# Patient Record
Sex: Male | Born: 1990 | Hispanic: Yes | Marital: Single | Smoking: Current every day smoker
Health system: Southern US, Community
[De-identification: ages and names within clinical notes are randomized; demographics above are authoritative.]

## PROBLEM LIST (undated history)

## (undated) DIAGNOSIS — J40 Bronchitis, not specified as acute or chronic: Secondary | ICD-10-CM

---

## 2020-08-11 ENCOUNTER — Encounter: Payer: Self-pay | Admitting: Emergency Medicine

## 2020-08-11 ENCOUNTER — Other Ambulatory Visit: Payer: Self-pay

## 2020-08-11 ENCOUNTER — Emergency Department
Admission: EM | Admit: 2020-08-11 | Discharge: 2020-08-11 | Disposition: A | Discharge status: Home / Self Care | Payer: Self-pay | Attending: Emergency Medicine | Admitting: Emergency Medicine

## 2020-08-11 ENCOUNTER — Emergency Department: Payer: Self-pay

## 2020-08-11 DIAGNOSIS — S0081XA Abrasion of other part of head, initial encounter: Secondary | ICD-10-CM | POA: Insufficient documentation

## 2020-08-11 DIAGNOSIS — W2111XA Struck by baseball bat, initial encounter: Secondary | ICD-10-CM | POA: Insufficient documentation

## 2020-08-11 DIAGNOSIS — Y92019 Unspecified place in single-family (private) house as the place of occurrence of the external cause: Secondary | ICD-10-CM | POA: Insufficient documentation

## 2020-08-11 DIAGNOSIS — T07XXXA Unspecified multiple injuries, initial encounter: Secondary | ICD-10-CM

## 2020-08-11 DIAGNOSIS — S0003XA Contusion of scalp, initial encounter: Secondary | ICD-10-CM | POA: Insufficient documentation

## 2020-08-11 MED ORDER — NAPROXEN 500 MG PO TABS: 500.0000 mg | ORAL_TABLET | Freq: Two times a day (BID) | ORAL | 0 refills | Status: DC

## 2020-08-11 MED ORDER — CYCLOBENZAPRINE HCL 10 MG PO TABS
10.0000 mg | ORAL_TABLET | Freq: Once | ORAL | Status: AC
  Administered 2020-08-11: 10 mg via ORAL
  Filled 2020-08-11: qty 1

## 2020-08-11 MED ORDER — NAPROXEN 500 MG PO TABS
500.0000 mg | ORAL_TABLET | Freq: Once | ORAL | Status: AC
  Administered 2020-08-11: 500 mg via ORAL
  Filled 2020-08-11: qty 1

## 2020-08-11 MED ORDER — ORPHENADRINE CITRATE ER 100 MG PO TB12: 100.0000 mg | ORAL_TABLET | Freq: Two times a day (BID) | ORAL | 0 refills | Status: DC
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
## 2020-08-11 NOTE — Discharge Instructions (Signed)
No acute findings on CT of the head and neck.  X-ray was negative for left shoulder fracture.  Read and follow discharge care instruction.  Wear arm sling for 2 days for comfort.  Take medication as directed.
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
## 2020-08-11 NOTE — ED Notes (Signed)
Video interpreter Britta Mccreedy [phone]

## 2020-08-11 NOTE — ED Notes (Signed)
Video Interpreter Meta Hatchet [phone]
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
## 2021-08-07 ENCOUNTER — Emergency Department: Payer: Self-pay

## 2021-08-07 ENCOUNTER — Emergency Department
Admission: EM | Admit: 2021-08-07 | Discharge: 2021-08-08 | Disposition: A | Discharge status: Home / Self Care | Payer: Self-pay | Attending: Emergency Medicine | Admitting: Emergency Medicine

## 2021-08-07 ENCOUNTER — Encounter: Payer: Self-pay | Admitting: Emergency Medicine

## 2021-08-07 ENCOUNTER — Other Ambulatory Visit: Payer: Self-pay

## 2021-08-07 DIAGNOSIS — W3400XA Accidental discharge from unspecified firearms or gun, initial encounter: Secondary | ICD-10-CM | POA: Insufficient documentation

## 2021-08-07 DIAGNOSIS — S61239A Puncture wound without foreign body of unspecified finger without damage to nail, initial encounter: Secondary | ICD-10-CM

## 2021-08-07 DIAGNOSIS — S68121A Partial traumatic metacarpophalangeal amputation of left index finger, initial encounter: Secondary | ICD-10-CM | POA: Insufficient documentation

## 2021-08-07 DIAGNOSIS — S68119A Complete traumatic metacarpophalangeal amputation of unspecified finger, initial encounter: Secondary | ICD-10-CM

## 2021-08-07 MED ORDER — LIDOCAINE HCL (PF) 1 % IJ SOLN
10.0000 mL | Freq: Once | INTRAMUSCULAR | Status: AC
  Administered 2021-08-07: 10 mL via INTRADERMAL
  Filled 2021-08-07: qty 10

## 2021-08-07 MED ORDER — MORPHINE SULFATE (PF) 4 MG/ML IV SOLN
4.0000 mg | Freq: Once | INTRAVENOUS | Status: AC
  Administered 2021-08-07: 4 mg via INTRAVENOUS
  Filled 2021-08-07: qty 1
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
## 2021-08-07 NOTE — ED Notes (Signed)
Dr. Larinda Buttery at the bedside, using online interpreter
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
## 2021-08-08 MED ORDER — OXYCODONE-ACETAMINOPHEN 5-325 MG PO TABS: 1.0000 | ORAL_TABLET | ORAL | 0 refills | Status: DC | PRN

## 2021-08-08 MED ORDER — OXYCODONE-ACETAMINOPHEN 5-325 MG PO TABS
1.0000 | ORAL_TABLET | Freq: Once | ORAL | Status: AC
  Administered 2021-08-08: 1 via ORAL
  Filled 2021-08-08: qty 1

## 2021-08-08 MED ORDER — CEPHALEXIN 500 MG PO CAPS: 500.0000 mg | ORAL_CAPSULE | Freq: Four times a day (QID) | ORAL | 0 refills | Status: AC

## 2021-08-08 NOTE — ED Notes (Signed)
Pt's left hand and wound to left 2nd digit was cleaned with sterile water and betadine. Xeroform and hemostatic gauze wrap around pt's 2nd digit of left hand, secure with guaze, finger spliint and left hand wrapped in Kerlix.

## 2021-08-08 NOTE — ED Notes (Signed)
Dr. Charna Archer at the bedside to discuss POC and discharge, using online interpreter

## 2021-08-08 NOTE — ED Notes (Signed)
Pt reports pain to his left hand/2nd digit, Dr. Charna Archer made aware, refer to [facility]
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
## 2021-08-08 NOTE — ED Notes (Signed)
C-com called/officer will be sent
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
## 2021-08-08 NOTE — ED Notes (Signed)
[employer] office Dewaine Conger to speak with pt to get more details of location

## 2021-08-08 NOTE — ED Notes (Signed)
[facility] at the bedside

## 2022-04-18 ENCOUNTER — Other Ambulatory Visit: Payer: Self-pay

## 2022-04-18 ENCOUNTER — Emergency Department: Payer: Self-pay

## 2022-04-18 ENCOUNTER — Inpatient Hospital Stay
Admission: EM | Admit: 2022-04-18 | Discharge: 2022-04-21 | DRG: 202 | Disposition: A | Discharge status: Home / Self Care | Payer: Self-pay | Attending: Family Medicine | Admitting: Family Medicine

## 2022-04-18 DIAGNOSIS — Z833 Family history of diabetes mellitus: Secondary | ICD-10-CM

## 2022-04-18 DIAGNOSIS — J209 Acute bronchitis, unspecified: Principal | ICD-10-CM | POA: Diagnosis present

## 2022-04-18 DIAGNOSIS — F10130 Alcohol abuse with withdrawal, uncomplicated: Secondary | ICD-10-CM | POA: Diagnosis present

## 2022-04-18 DIAGNOSIS — J9601 Acute respiratory failure with hypoxia: Secondary | ICD-10-CM

## 2022-04-18 DIAGNOSIS — D72829 Elevated white blood cell count, unspecified: Secondary | ICD-10-CM | POA: Diagnosis not present

## 2022-04-18 DIAGNOSIS — F1210 Cannabis abuse, uncomplicated: Secondary | ICD-10-CM | POA: Diagnosis present

## 2022-04-18 DIAGNOSIS — T380X5A Adverse effect of glucocorticoids and synthetic analogues, initial encounter: Secondary | ICD-10-CM | POA: Diagnosis not present

## 2022-04-18 DIAGNOSIS — F1010 Alcohol abuse, uncomplicated: Secondary | ICD-10-CM | POA: Diagnosis present

## 2022-04-18 DIAGNOSIS — Z1152 Encounter for screening for COVID-19: Secondary | ICD-10-CM

## 2022-04-18 LAB — RESP PANEL BY RT-PCR (RSV, FLU A&B, COVID)  RVPGX2
Influenza A by PCR: NEGATIVE
Influenza B by PCR: NEGATIVE
Resp Syncytial Virus by PCR: NEGATIVE
SARS Coronavirus 2 by RT PCR: NEGATIVE

## 2022-04-18 LAB — BASIC METABOLIC PANEL
Anion gap: 10 (ref 5–15)
BUN: 13 mg/dL (ref 6–20)
CO2: 19 mmol/L — ABNORMAL LOW (ref 22–32)
Calcium: 8.2 mg/dL — ABNORMAL LOW (ref 8.9–10.3)
Chloride: 108 mmol/L (ref 98–111)
Creatinine, Ser: 0.9 mg/dL (ref 0.61–1.24)
GFR, Estimated: >60 mL/min (ref >60)
Glucose, Bld: 147 mg/dL — ABNORMAL HIGH (ref 70–99)
Potassium: 3.5 mmol/L (ref 3.5–5.1)
Sodium: 137 mmol/L (ref 135–145)

## 2022-04-18 LAB — CBC
HCT: 53.2 % — ABNORMAL HIGH (ref 39.0–52.0)
Hemoglobin: 18.7 g/dL — ABNORMAL HIGH (ref 13.0–17.0)
MCH: 31.1 pg (ref 26.0–34.0)
MCHC: 35.2 g/dL (ref 30.0–36.0)
MCV: 88.4 fL (ref 80.0–100.0)
Platelets: 267 10*3/uL (ref 150–400)
RBC: 6.02 MIL/uL — ABNORMAL HIGH (ref 4.22–5.81)
RDW: 12.2 % (ref 11.5–15.5)
WBC: 8.8 10*3/uL (ref 4.0–10.5)
nRBC: 0 % (ref 0.0–0.2)

## 2022-04-18 LAB — TROPONIN I (HIGH SENSITIVITY)
Troponin I (High Sensitivity): 4 ng/L (ref <18)
Troponin I (High Sensitivity): 5 ng/L (ref <18)

## 2022-04-18 MED ORDER — ALBUTEROL SULFATE (2.5 MG/3ML) 0.083% IN NEBU: 2.5000 mg | INHALATION_SOLUTION | Freq: Once | RESPIRATORY_TRACT | Status: DC

## 2022-04-18 MED ORDER — SODIUM CHLORIDE 0.9 % IV BOLUS
500.0000 mL | Freq: Once | INTRAVENOUS | Status: AC
  Administered 2022-04-18: 500 mL via INTRAVENOUS

## 2022-04-18 MED ORDER — IPRATROPIUM-ALBUTEROL 0.5-2.5 (3) MG/3ML IN SOLN
3.0000 mL | Freq: Four times a day (QID) | RESPIRATORY_TRACT | Status: DC
  Administered 2022-04-19 – 2022-04-21 (×7): 3 mL via RESPIRATORY_TRACT
  Filled 2022-04-18 (×7): qty 3

## 2022-04-18 MED ORDER — ACETAMINOPHEN 325 MG PO TABS
650.0000 mg | ORAL_TABLET | Freq: Four times a day (QID) | ORAL | Status: DC | PRN
  Administered 2022-04-19 – 2022-04-21 (×2): 650 mg via ORAL
  Filled 2022-04-18 (×2): qty 2

## 2022-04-18 MED ORDER — TRAZODONE HCL 50 MG PO TABS: 25.0000 mg | ORAL_TABLET | Freq: Every evening | ORAL | Status: DC | PRN

## 2022-04-18 MED ORDER — IPRATROPIUM-ALBUTEROL 0.5-2.5 (3) MG/3ML IN SOLN
3.0000 mL | Freq: Once | RESPIRATORY_TRACT | Status: AC
  Administered 2022-04-18: 3 mL via RESPIRATORY_TRACT
  Filled 2022-04-18: qty 3

## 2022-04-18 MED ORDER — ONDANSETRON HCL 4 MG/2ML IJ SOLN: 4.0000 mg | Freq: Four times a day (QID) | INTRAMUSCULAR | Status: DC | PRN

## 2022-04-18 MED ORDER — ENOXAPARIN SODIUM 40 MG/0.4ML IJ SOSY
40.0000 mg | PREFILLED_SYRINGE | INTRAMUSCULAR | Status: DC
  Administered 2022-04-19 – 2022-04-21 (×3): 40 mg via SUBCUTANEOUS
  Filled 2022-04-18 (×3): qty 0.4

## 2022-04-18 MED ORDER — METHYLPREDNISOLONE SODIUM SUCC 125 MG IJ SOLR
125.0000 mg | Freq: Once | INTRAMUSCULAR | Status: AC
  Administered 2022-04-18: 125 mg via INTRAVENOUS
  Filled 2022-04-18: qty 2

## 2022-04-18 MED ORDER — ONDANSETRON HCL 4 MG PO TABS: 4.0000 mg | ORAL_TABLET | Freq: Four times a day (QID) | ORAL | Status: DC | PRN

## 2022-04-18 MED ORDER — ALBUTEROL (5 MG/ML) CONTINUOUS INHALATION SOLN
10.0000 mg/h | INHALATION_SOLUTION | Freq: Once | RESPIRATORY_TRACT | Status: AC
  Administered 2022-04-18: 10 mg/h via RESPIRATORY_TRACT
  Filled 2022-04-18: qty 20

## 2022-04-18 MED ORDER — PREDNISONE 20 MG PO TABS
40.0000 mg | ORAL_TABLET | Freq: Every day | ORAL | Status: DC
  Administered 2022-04-20 – 2022-04-21 (×2): 40 mg via ORAL
  Filled 2022-04-18 (×2): qty 2

## 2022-04-18 MED ORDER — METHYLPREDNISOLONE SODIUM SUCC 40 MG IJ SOLR
40.0000 mg | Freq: Two times a day (BID) | INTRAMUSCULAR | Status: AC
  Administered 2022-04-19 (×2): 40 mg via INTRAVENOUS
  Filled 2022-04-18 (×2): qty 1

## 2022-04-18 MED ORDER — SODIUM CHLORIDE 0.9 % IV SOLN: INTRAVENOUS | Status: DC

## 2022-04-18 MED ORDER — MAGNESIUM SULFATE 2 GM/50ML IV SOLN
2.0000 g | Freq: Once | INTRAVENOUS | Status: AC
  Administered 2022-04-18: 2 g via INTRAVENOUS
  Filled 2022-04-18: qty 50

## 2022-04-18 MED ORDER — ACETAMINOPHEN 650 MG RE SUPP: 650.0000 mg | Freq: Four times a day (QID) | RECTAL | Status: DC | PRN

## 2022-04-18 MED ORDER — MAGNESIUM HYDROXIDE 400 MG/5ML PO SUSP: 30.0000 mL | Freq: Every day | ORAL | Status: DC | PRN

## 2022-04-18 NOTE — ED Provider Notes (Signed)
[facility] Provider Note    Event Date/Time   First MD Initiated Contact with Patient 04/18/22 1946     (approximate)   History   Shortness of Breath   HPI  Vincent Bradshaw is a 32 y.o. male with no significant past medical history who presents with increased shortness of breath over the last 3 days with a feeling of "choking" today which he mainly feels in his chest rather than in the throat.  The patient states that he has had a persistent cough for about 3 weeks which was getting better until the last several days.  He denies any fevers and has no vomiting or diarrhea.  He denies sore throat.  He states his kids were sick with similar symptoms.  I reviewed the past medical records.  The patient was most recently seen in the ED in June of last year for a finger injury.  He has no prior documented medical encounters outside of the ED.   Physical Exam   Triage Vital Signs: ED Triage Vitals  Enc Vitals Group     BP 04/18/22 1926 (!) 145/102     Pulse Rate 04/18/22 1926 (!) 116     Resp 04/18/22 1926 (!) 38     Temp 04/18/22 1926 97.9 F (36.6 C)     Temp Source 04/18/22 1926 Oral     SpO2 04/18/22 1926 94 %     Weight 04/18/22 1941 144 lb (65.3 kg)     Height 04/18/22 1941 4' 11.06" (1.5 m)     Head Circumference --      Peak Flow --      Pain Score 04/18/22 1941 5     Pain Loc --      Pain Edu? --      Excl. in [place]? --     Most recent vital signs: Vitals:   04/18/22 2338 04/18/22 2339  BP:    Pulse:    Resp:    Temp:    SpO2: 94% 94%     General: Awake, no distress.  CV:  Good peripheral perfusion.  Normal heart sounds. Resp:  Slightly increased respiratory effort.  Diffuse wheezing bilaterally with poor air entry. Abd:  No distention.  Other:  No peripheral edema.  Oropharynx clear with no erythema or exudate.  No pooled secretions or stridor.   ED Results / Procedures / Treatments   Labs (all labs ordered are  listed, but only abnormal results are displayed) Labs Reviewed  BASIC METABOLIC PANEL - Abnormal; Notable for the following components:      Result Value   CO2 19 (*)    Glucose, Bld 147 (*)    Calcium 8.2 (*)    All other components within normal limits  CBC - Abnormal; Notable for the following components:   RBC 6.02 (*)    Hemoglobin 18.7 (*)    HCT 53.2 (*)    All other components within normal limits  RESP PANEL BY RT-PCR (RSV, FLU A&B, COVID)  [id number]  TROPONIN I (HIGH SENSITIVITY)  TROPONIN I (HIGH SENSITIVITY)     EKG  ED ECG REPORT I, Arta Silence, the attending physician, personally viewed and interpreted this ECG.  Date: 04/18/2022 EKG Time: 1933  Rate: 112 Rhythm: Sinus tachycardia QRS Axis: Right axis Intervals: normal ST/T Wave abnormalities: normal Narrative Interpretation: no evidence of acute ischemia    RADIOLOGY  Chest x-ray: I independently viewed and interpreted the images; there is no focal  consolidation or edema  PROCEDURES:  Critical Care performed: No  .Critical Care  Performed by: Arta Silence, MD Authorized by: Arta Silence, MD   Critical care provider statement:    Critical care time (minutes):  30   Critical care time was exclusive of:  Separately billable procedures and treating other patients   Critical care was necessary to treat or prevent imminent or life-threatening deterioration of the following conditions:  Respiratory failure   Critical care was time spent personally by me on the following activities:  Development of treatment plan with patient or surrogate, discussions with consultants, evaluation of patient's response to treatment, examination of patient, ordering and review of laboratory studies, ordering and review of radiographic studies, ordering and performing treatments and interventions, pulse oximetry, re-evaluation of patient's condition, review of old charts and obtaining history from patient or  surrogate   Care discussed with: admitting provider      MEDICATIONS ORDERED IN ED: Medications  ipratropium-albuterol (DUONEB) 0.5-2.5 (3) MG/3ML nebulizer solution 3 mL (3 mLs Nebulization Given 04/18/22 2015)  ipratropium-albuterol (DUONEB) 0.5-2.5 (3) MG/3ML nebulizer solution 3 mL (3 mLs Nebulization Given 04/18/22 2027)  methylPREDNISolone sodium succinate (SOLU-MEDROL) 125 mg/2 mL injection 125 mg (125 mg Intravenous Given 04/18/22 2015)  magnesium sulfate IVPB 2 g 50 mL (0 g Intravenous Stopped 04/18/22 2115)  sodium chloride 0.9 % bolus 500 mL (0 mLs Intravenous Stopped 04/18/22 2130)  sodium chloride 0.9 % bolus 500 mL (0 mLs Intravenous Stopped 04/18/22 2220)  albuterol (PROVENTIL,VENTOLIN) solution continuous neb (10 mg/hr Nebulization Given 04/18/22 2220)     IMPRESSION / MDM / [place] / ED COURSE  I reviewed the triage vital signs and the nursing notes.  32 year old male with PMH as noted above presents with cough for about 3 weeks, but with worsening shortness of breath and chest tightness over the last several days.  On exam the patient has significant wheezing and somewhat poor air entry although does not appear in respiratory distress.  O2 saturation is in the mid 90s on room air.  Differential diagnosis includes, but is not limited to, acute bronchitis, reactive airways/bronchospasm, pneumonia, COVID-19, influenza, other viral syndrome.  We will obtain chest x-ray, lab workup, respiratory panel, give fluids, bronchodilators, steroid, and magnesium and reassess.  Patient's presentation is most consistent with acute presentation with potential threat to life or bodily function.  The patient is on the cardiac monitor to evaluate for evidence of arrhythmia and/or significant heart rate changes.   ----------------------------------------- 11:42 PM on 04/18/2022 -----------------------------------------  Respiratory panel is negative and the chest x-ray does not show  any evidence of pneumonia.  There is no leukocytosis.  Troponin is negative.  The patient reports somewhat improved symptoms after the initial treatment and appears slightly more comfortable.  However, he remains tachypneic to around 25-30, is still somewhat tachycardic.  O2 saturation is in the low 90s on room air when he is at rest, but drops to the mid 80s with minimal exertion.  Therefore I recommend admission for further treatment of acute bronchitis.  I consulted Dr. Sidney Ace  from the hospitalist service; based on our discussion he agrees to admit the patient.   FINAL CLINICAL IMPRESSION(S) / ED DIAGNOSES   Final diagnoses:  Acute bronchitis, unspecified organism  Acute respiratory failure with hypoxia ([place])     Rx / DC Orders   ED Discharge Orders     None        Note:  This document was prepared using Dragon  voice recognition software and may include unintentional dictation errors.    Arta Silence, MD 04/18/22 [phone]

## 2022-04-18 NOTE — ED Triage Notes (Signed)
Patient arrives to ED via POV ambulatory with steady gait stating he has had a cough for the past 22 days. States last night he began having shortness of breath and felt like he was choking. Reports pain to head and chest when he coughs. Patient with tachypnea and accessory muscle use.   Translation services used [place] # [phone]

## 2022-04-18 NOTE — H&P (Signed)
[place]   PATIENT NAME: Vincent Bradshaw    MR#:  [id number]  DATE OF BIRTH:  17-Dec-1990  DATE OF ADMISSION:  04/18/2022  PRIMARY CARE PHYSICIAN: Pcp, No   Patient is coming from: Home  REQUESTING/REFERRING PHYSICIAN: Ane Payment, MD  CHIEF COMPLAINT:   Chief Complaint  Patient presents with   Shortness of Breath    HISTORY OF PRESENT ILLNESS:  Vincent Bradshaw is a 32 y.o. male with medical history significant for marijuana and call abuse, who presented to the emergency room with acute onset of worsening dyspnea with inability to breathe for couple of days as well as cough over the last 22 days as well as associated wheezing intermittently over the last 22 days.  He denied any fever or chills.  No nausea or vomiting or abdominal pain.  He has been having chest pain only with cough with no radiation or diaphoresis.  No rhinorrhea or nasal congestion or sore throat or earache.  No dysuria, oliguria or hematuria or flank pain.  No headache or dizziness or blurred vision.  ED Course: When he came to the ER, BP was 145/102 and heart rate 116 with respiratory rate of 38 and pulse 70 was 94% on room air and went down to 90% then upon ambulation it dropped to 85% on room air.  Labs revealed a CO2 of 19 glucose of 147 and calcium 8.2 with otherwise unremarkable BMP.  CBC showed hemoconcentration.  High-sensitivity troponin I came back negative twice.  Influenza, RSV and COVID-19 PCR came back negative. EKG as reviewed by me : Sinus tachycardia with a rate of 112 with right axis deviation. Imaging: Portable chest x-ray showed no acute cardiopulmonary disease.  The patient was given IV magnesium sulfate, 2 DuoNebs, IV Solu-Medrol and continuous albuterol nebulizer as well as 2 g of IV Rocephin PAST MEDICAL HISTORY:   Marijuana and alcohol abuse  PAST SURGICAL HISTORY:  Washout of left index finger injury.  SOCIAL HISTORY:   Social History    Tobacco Use   Smoking status: Never   Smokeless tobacco: Never  Substance Use Topics   Alcohol use: Yes    Comment: Occ    FAMILY HISTORY:  Positive for diabetes mellitus and his grandparents.  DRUG ALLERGIES:  No Known Allergies  REVIEW OF SYSTEMS:   ROS As per history of present illness. All pertinent systems were reviewed above. Constitutional, HEENT, cardiovascular, respiratory, GI, GU, musculoskeletal, neuro, psychiatric, endocrine, integumentary and hematologic systems were reviewed and are otherwise negative/unremarkable except for positive findings mentioned above in the HPI.   MEDICATIONS AT HOME:   Prior to Admission medications   Not on File      VITAL SIGNS:  Blood pressure 137/82, pulse (!) 118, temperature 99.4 F (37.4 C), temperature source Oral, resp. rate (!) 28, height 4' 11.06" (1.5 m), weight 65.3 kg, SpO2 94 %.  PHYSICAL EXAMINATION:  Physical Exam  GENERAL:  32 y.o.-year-old Hispanic male patient lying in the bed with mild respiratory distress with conversational dyspnea. EYES: Pupils equal, round, reactive to light and accommodation. No scleral icterus. Extraocular muscles intact.  HEENT: Head atraumatic, normocephalic. Oropharynx and nasopharynx clear.  NECK:  Supple, no jugular venous distention. No thyroid enlargement, no tenderness.  LUNGS: Diffuse expiratory wheezes with tight expiratory airflow and harsh vesicular breathing.  n. No use of accessory muscles of respiration.  CARDIOVASCULAR: Regular rate and rhythm, S1, S2 normal. No murmurs, rubs, or gallops.  ABDOMEN: Soft, nondistended,  nontender. Bowel sounds present. No organomegaly or mass.  EXTREMITIES: No pedal edema, cyanosis, or clubbing.  NEUROLOGIC: Cranial nerves II through XII are intact. Muscle strength 5/5 in all extremities. Sensation intact. Gait not checked.  PSYCHIATRIC: The patient is alert and oriented x 3.  Normal affect and good eye contact. SKIN: No obvious rash,  lesion, or ulcer.   LABORATORY PANEL:   CBC Recent Labs  Lab 04/18/22 1945  WBC 8.8  HGB 18.7*  HCT 53.2*  PLT 267   ------------------------------------------------------------------------------------------------------------------  Chemistries  Recent Labs  Lab 04/18/22 1945  NA 137  K 3.5  CL 108  CO2 19*  GLUCOSE 147*  BUN 13  CREATININE 0.90  CALCIUM 8.2*   ------------------------------------------------------------------------------------------------------------------  Cardiac Enzymes No results for input(s): "TROPONINI" in the last 168 hours. ------------------------------------------------------------------------------------------------------------------  RADIOLOGY:  DG Chest Port 1 View  Result Date: 04/18/2022 CLINICAL DATA:  Shortness of breath EXAM: PORTABLE CHEST 1 VIEW COMPARISON:  None Available. FINDINGS: The heart size and mediastinal contours are within normal limits. Both lungs are clear. The visualized skeletal structures are unremarkable. IMPRESSION: No active disease. Electronically Signed   By: Ronney Asters M.D.   On: 04/18/2022 20:41      IMPRESSION AND PLAN:  Assessment and Plan: * Acute bronchitis with bronchospasm - The patient will be admitted to a medically monitored observation bed. - We will place the patient IV steroid therapy with IV Solu-Medrol as well as nebulized bronchodilator therapy with duonebs q.i.d. and q.4 hours p.r.n.Marland Kitchen - Mucolytic therapy will be provided with Mucinex and antibiotic therapy with IV Rocephin. - O2 protocol will be followed.   Acute respiratory failure with hypoxia (HCC) - This is clearly secondary to #1. - O2 protocol will be followed. - Given associated sinus tachycardia, will obtain a chest CTA to rule out PE.   Alcohol abuse - He will be monitored for alcohol withdrawal. - He will be placed on as needed IV Ativan.   Marijuana abuse - I counseled her for cessation of smoking  marijuana.   DVT prophylaxis: Lovenox.  Advanced Care Planning:  Code Status: full code.  Family Communication:  The plan of care was discussed in details with the patient (and family). I answered all questions. The patient agreed to proceed with the above mentioned plan. Further management will depend upon hospital course. Disposition Plan: Back to previous home environment Consults called: none.  All the records are reviewed and case discussed with ED provider.  Status is: Observation  I certify that at the time of admission, it is my clinical judgment that the patient will require hospital care extending less than 2 midnights.                            Dispo: The patient is from: Home              Anticipated d/c is to: Home              Patient currently is not medically stable to d/c.              Difficult to place patient: No  Christel Mormon M.D on 04/19/2022 at 12:32 AM  [facility]   From 7 PM-7 AM, contact night-coverage [email or web address]  CC: Primary care physician; Pcp, No

## 2022-04-19 ENCOUNTER — Observation Stay: Payer: Self-pay

## 2022-04-19 DIAGNOSIS — F1010 Alcohol abuse, uncomplicated: Secondary | ICD-10-CM | POA: Insufficient documentation

## 2022-04-19 DIAGNOSIS — F1210 Cannabis abuse, uncomplicated: Secondary | ICD-10-CM | POA: Insufficient documentation

## 2022-04-19 DIAGNOSIS — J9601 Acute respiratory failure with hypoxia: Secondary | ICD-10-CM

## 2022-04-19 LAB — BASIC METABOLIC PANEL
Anion gap: 11 (ref 5–15)
BUN: 16 mg/dL (ref 6–20)
CO2: 19 mmol/L — ABNORMAL LOW (ref 22–32)
Calcium: 9.3 mg/dL (ref 8.9–10.3)
Chloride: 105 mmol/L (ref 98–111)
Creatinine, Ser: 0.95 mg/dL (ref 0.61–1.24)
GFR, Estimated: >60 mL/min (ref >60)
Glucose, Bld: 226 mg/dL — ABNORMAL HIGH (ref 70–99)
Potassium: 4.1 mmol/L (ref 3.5–5.1)
Sodium: 135 mmol/L (ref 135–145)

## 2022-04-19 LAB — CBC
HCT: 51.1 % (ref 39.0–52.0)
Hemoglobin: 17.5 g/dL — ABNORMAL HIGH (ref 13.0–17.0)
MCH: 30.3 pg (ref 26.0–34.0)
MCHC: 34.2 g/dL (ref 30.0–36.0)
MCV: 88.4 fL (ref 80.0–100.0)
Platelets: 297 10*3/uL (ref 150–400)
RBC: 5.78 MIL/uL (ref 4.22–5.81)
RDW: 12.6 % (ref 11.5–15.5)
WBC: 18.7 10*3/uL — ABNORMAL HIGH (ref 4.0–10.5)
nRBC: 0 % (ref 0.0–0.2)

## 2022-04-19 LAB — HIV ANTIBODY (ROUTINE TESTING W REFLEX): HIV Screen 4th Generation wRfx: NONREACTIVE

## 2022-04-19 MED ORDER — LORAZEPAM 1 MG PO TABS: 1.0000 mg | ORAL_TABLET | ORAL | Status: DC | PRN

## 2022-04-19 MED ORDER — GUAIFENESIN ER 600 MG PO TB12
600.0000 mg | ORAL_TABLET | Freq: Two times a day (BID) | ORAL | Status: DC
  Administered 2022-04-19 – 2022-04-21 (×6): 600 mg via ORAL
  Filled 2022-04-19 (×6): qty 1

## 2022-04-19 MED ORDER — LORAZEPAM 2 MG/ML IJ SOLN: 1.0000 mg | INTRAMUSCULAR | Status: DC | PRN

## 2022-04-19 MED ORDER — IOHEXOL 350 MG/ML SOLN
75.0000 mL | Freq: Once | INTRAVENOUS | Status: AC | PRN
  Administered 2022-04-19: 75 mL via INTRAVENOUS

## 2022-04-19 MED ORDER — THIAMINE HCL 100 MG/ML IJ SOLN: 100.0000 mg | Freq: Every day | INTRAMUSCULAR | Status: DC

## 2022-04-19 MED ORDER — THIAMINE MONONITRATE 100 MG PO TABS
100.0000 mg | ORAL_TABLET | Freq: Every day | ORAL | Status: DC
  Administered 2022-04-19 – 2022-04-21 (×3): 100 mg via ORAL
  Filled 2022-04-19 (×3): qty 1

## 2022-04-19 MED ORDER — SODIUM CHLORIDE 0.9 % IV SOLN
1.0000 g | INTRAVENOUS | Status: DC
  Administered 2022-04-19 – 2022-04-21 (×3): 1 g via INTRAVENOUS
  Filled 2022-04-19 (×2): qty 10
  Filled 2022-04-19: qty 1

## 2022-04-19 MED ORDER — FOLIC ACID 1 MG PO TABS
1.0000 mg | ORAL_TABLET | Freq: Every day | ORAL | Status: DC
  Administered 2022-04-19 – 2022-04-21 (×3): 1 mg via ORAL
  Filled 2022-04-19 (×3): qty 1

## 2022-04-19 MED ORDER — HYDROCOD POLI-CHLORPHE POLI ER 10-8 MG/5ML PO SUER
5.0000 mL | Freq: Two times a day (BID) | ORAL | Status: DC | PRN
  Administered 2022-04-20: 5 mL via ORAL
  Filled 2022-04-19: qty 5

## 2022-04-19 MED ORDER — ADULT MULTIVITAMIN W/MINERALS CH
1.0000 | ORAL_TABLET | Freq: Every day | ORAL | Status: DC
  Administered 2022-04-19 – 2022-04-21 (×3): 1 via ORAL
  Filled 2022-04-19 (×3): qty 1

## 2022-04-19 NOTE — Assessment & Plan Note (Signed)
-   The patient will be admitted to a medically monitored observation bed. - We will place the patient IV steroid therapy with IV Solu-Medrol as well as nebulized bronchodilator therapy with duonebs q.i.d. and q.4 hours p.r.n.Marland Kitchen - Mucolytic therapy will be provided with Mucinex and antibiotic therapy with IV Rocephin. - O2 protocol will be followed.

## 2022-04-19 NOTE — ED Notes (Signed)
Report messaged to [employer]

## 2022-04-19 NOTE — ED Notes (Signed)
Patient transported to CT 

## 2022-04-19 NOTE — Assessment & Plan Note (Signed)
-   I counseled her for cessation of smoking marijuana.

## 2022-04-19 NOTE — Assessment & Plan Note (Addendum)
-   This is clearly secondary to #1. - O2 protocol will be followed. - Given associated sinus tachycardia, will obtain a chest CTA to rule out PE.

## 2022-04-19 NOTE — Progress Notes (Signed)
PROGRESS NOTE    Vincent Bradshaw  [id number] DOB: 06/17/1990 DOA: 04/18/2022 PCP: Pcp, No    Brief Narrative: This 32 years old male with PMH significant for marijuana and substance abuse presented to the ED with acute onset of worsening shortness of breath and inability to breathe for couple of days, He also reports  cough for over last 22 days as well as associated wheezing intermittently over last 22 days.  He has been having chest pain only with cough with no radiation or diaphoresis.  He was tachypneic, tachycardic,  normotensive in the ED.  He was hypoxic with SpO2 of 85% on room air requiring 4 L of oxygen to maintain saturation above 92%.  High-sensitivity troponin negative.,  RSV, COVID and influenza negative.  Chest x-ray showed no acute cardiopulmonary disease. Patient was given magnesium sulfate, DuoNebs, IV Solu-Medrol and continuous albuterol nebulizer as well as IV Rocephin and admitted for possible bronchitis with bronchospasm.  Assessment & Plan:   Principal Problem:   Acute bronchitis with bronchospasm Active Problems:   Marijuana abuse   Alcohol abuse   Acute respiratory failure with hypoxia (HCC)  Acute bronchitis with bronchospasm: Patient denies any history of asthma. He presented with acute shortness of breath, significant wheezing requiring magnesium sulfate, DuoNeb, Solu-Medrol.  Continue IV Solu-Medrol 40 mg q 12hr . Continue with scheduled and as needed bronchodilators. Continue mucolytic therapy with Mucinex. Continue ceftriaxone for possible bronchitis. Continue supplemental oxygen and wean as tolerated. CTA chest ruled out pulmonary embolism or aortic dissection.  Acute hypoxic respiratory failure secondary to above. Continue supplemental oxygen and wean as tolerated. Continue antibiotics ( Ceftriaxone ) Continue Solu-Medrol 40 mg every 12 hours.  Alcohol abuse. Continue CIWA protocol. Continue to monitor for alcohol  withdrawal.  Marijuana abuse. He is counseled on cessation of smoking marijuana.   DVT prophylaxis:Lovenox Code Status:Full code Family Communication:No family at bed side Disposition Plan:    Status is: Observation The patient remains OBS appropriate and will d/c before 2 midnights.  Admitted for acute bronchitis with bronchospasm requiring Solu-Medrol and nebulized podiatrist.   Consultants:  None  Procedures: CT chest Antimicrobials: Ceftriaxone  Subjective: Patient seen and examined at bedside.  Overnight events noted.   Patient reports doing better still reports having some tightness but denies any chest pain and lightheadedness.  Objective: Vitals:   04/19/22 0800 04/19/22 0815 04/19/22 0945 04/19/22 1000  BP: 129/73   (!) 120/57  Pulse: (!) 115   (!) 116  Resp:    (!) 22  Temp:      TempSrc:      SpO2:  93% 97%   Weight:      Height:        Intake/Output Summary (Last 24 hours) at 04/19/2022 1208 Last data filed at 04/19/2022 1108 Gross per 24 hour  Intake 1050 ml  Output 900 ml  Net 150 ml   Filed Weights   04/18/22 1941  Weight: 65.3 kg    Examination:  General exam: Appears calm and comfortable , not in any acute distress. Respiratory system: Wheezing bilaterally, no crackles, no accessory muscle use, RR 15 Cardiovascular system: S1 & S2 heard, regular rate and rhythm, no murmur. Gastrointestinal system: Abdomen is soft, non tender, non distended, BS+ Central nervous system: Alert and oriented x 3. No focal neurological deficits. Extremities: No edema, no cyanosis, no clubbing. Skin: No rashes, lesions or ulcers Psychiatry: Judgement and insight appear normal. Mood & affect appropriate.     Data Reviewed:  I have personally reviewed following labs and imaging studies  CBC: Recent Labs  Lab 04/18/22 1945  WBC 8.8  HGB 18.7*  HCT 53.2*  MCV 88.4  PLT 99991111   Basic Metabolic Panel: Recent Labs  Lab 04/18/22 1945  NA 137  K 3.5  CL  108  CO2 19*  GLUCOSE 147*  BUN 13  CREATININE 0.90  CALCIUM 8.2*   GFR: Estimated Creatinine Clearance: 93.9 mL/min (by C-G formula based on SCr of 0.9 mg/dL). Liver Function Tests: No results for input(s): "AST", "ALT", "ALKPHOS", "BILITOT", "PROT", "ALBUMIN" in the last 168 hours. No results for input(s): "LIPASE", "AMYLASE" in the last 168 hours. No results for input(s): "AMMONIA" in the last 168 hours. Coagulation Profile: No results for input(s): "INR", "PROTIME" in the last 168 hours. Cardiac Enzymes: No results for input(s): "CKTOTAL", "CKMB", "CKMBINDEX", "TROPONINI" in the last 168 hours. BNP (last 3 results) No results for input(s): "PROBNP" in the last 8760 hours. HbA1C: No results for input(s): "HGBA1C" in the last 72 hours. CBG: No results for input(s): "GLUCAP" in the last 168 hours. Lipid Profile: No results for input(s): "CHOL", "HDL", "LDLCALC", "TRIG", "CHOLHDL", "LDLDIRECT" in the last 72 hours. Thyroid Function Tests: No results for input(s): "TSH", "T4TOTAL", "FREET4", "T3FREE", "THYROIDAB" in the last 72 hours. Anemia Panel: No results for input(s): "VITAMINB12", "FOLATE", "FERRITIN", "TIBC", "IRON", "RETICCTPCT" in the last 72 hours. Sepsis Labs: No results for input(s): "PROCALCITON", "LATICACIDVEN" in the last 168 hours.  Recent Results (from the past 240 hour(s))  Resp panel by RT-PCR (RSV, Flu A&B, Covid) Anterior Nasal Swab     Status: None   Collection Time: 04/18/22  7:44 PM   Specimen: Anterior Nasal Swab  Result Value Ref Range Status   SARS Coronavirus 2 by RT PCR NEGATIVE NEGATIVE Final    Comment: (NOTE) SARS-CoV-2 target nucleic acids are NOT DETECTED.  The SARS-CoV-2 RNA is generally detectable in upper respiratory specimens during the acute phase of infection. The lowest concentration of SARS-CoV-2 viral copies this assay can detect is 138 copies/mL. A negative result does not preclude SARS-Cov-2 infection and should not be used as  the sole basis for treatment or other patient management decisions. A negative result may occur with  improper specimen collection/handling, submission of specimen other than nasopharyngeal swab, presence of viral mutation(s) within the areas targeted by this assay, and inadequate number of viral copies(<138 copies/mL). A negative result must be combined with clinical observations, patient history, and epidemiological information. The expected result is Negative.  Fact Sheet for Patients:  EntrepreneurPulse.com.au  Fact Sheet for Healthcare Providers:  IncredibleEmployment.be  This test is no t yet approved or cleared by the [place] FDA and  has been authorized for detection and/or diagnosis of SARS-CoV-2 by FDA under an Emergency Use Authorization (EUA). This EUA will remain  in effect (meaning this test can be used) for the duration of the COVID-19 declaration under Section 564(b)(1) of the Act, 21 U.S.C.section 360bbb-3(b)(1), unless the authorization is terminated  or revoked sooner.       Influenza A by PCR NEGATIVE NEGATIVE Final   Influenza B by PCR NEGATIVE NEGATIVE Final    Comment: (NOTE) The Xpert Xpress SARS-CoV-2/FLU/RSV plus assay is intended as an aid in the diagnosis of influenza from Nasopharyngeal swab specimens and should not be used as a sole basis for treatment. Nasal washings and aspirates are unacceptable for Xpert Xpress SARS-CoV-2/FLU/RSV testing.  Fact Sheet for Patients: EntrepreneurPulse.com.au  Fact Sheet for Healthcare Providers: IncredibleEmployment.be  This test is not yet approved or cleared by the [place] and has been authorized for detection and/or diagnosis of SARS-CoV-2 by FDA under an Emergency Use Authorization (EUA). This EUA will remain in effect (meaning this test can be used) for the duration of the COVID-19 declaration under Section 564(b)(1) of  the Act, 21 U.S.C. section 360bbb-3(b)(1), unless the authorization is terminated or revoked.     Resp Syncytial Virus by PCR NEGATIVE NEGATIVE Final    Comment: (NOTE) Fact Sheet for Patients: EntrepreneurPulse.com.au  Fact Sheet for Healthcare Providers: IncredibleEmployment.be  This test is not yet approved or cleared by the [place] FDA and has been authorized for detection and/or diagnosis of SARS-CoV-2 by FDA under an Emergency Use Authorization (EUA). This EUA will remain in effect (meaning this test can be used) for the duration of the COVID-19 declaration under Section 564(b)(1) of the Act, 21 U.S.C. section 360bbb-3(b)(1), unless the authorization is terminated or revoked.  Performed at [facility], [address] [place], [place] [address]     Radiology Studies: CT Angio Chest Pulmonary Embolism (PE) W or WO Contrast  Result Date: 04/19/2022 CLINICAL DATA:  Shortness of breath. EXAM: CT ANGIOGRAPHY CHEST WITH CONTRAST TECHNIQUE: Multidetector CT imaging of the chest was performed using the standard protocol during bolus administration of intravenous contrast. Multiplanar CT image reconstructions and MIPs were obtained to evaluate the vascular anatomy. RADIATION DOSE REDUCTION: This exam was performed according to the departmental dose-optimization program which includes automated exposure control, adjustment of the mA and/or kV according to patient size and/or use of iterative reconstruction technique. CONTRAST:  44m OMNIPAQUE IOHEXOL 350 MG/ML SOLN COMPARISON:  Chest radiograph dated 04/18/2022. FINDINGS: Evaluation of this exam is limited due to respiratory motion artifact. Cardiovascular: There is no cardiomegaly or pericardial effusion. The thoracic aorta is unremarkable. The origins of the great vessels of the aortic arch appear patent. Evaluation of the pulmonary arteries is limited due to respiratory motion. No  pulmonary artery embolus identified. Mediastinum/Nodes: No hilar or mediastinal adenopathy. The esophagus and the thyroid gland are grossly unremarkable. No mediastinal fluid collection. Lungs/Pleura: Faint bilateral lower lobe ground-glass densities, likely atelectasis. No focal consolidation, pleural effusion, or pneumothorax. The central airways are patent. Upper Abdomen: Fatty liver. Musculoskeletal: No acute osseous pathology. Review of the MIP images confirms the above findings. IMPRESSION: 1. No acute intrathoracic pathology. No CT evidence of pulmonary artery embolus. 2. Fatty liver. Electronically Signed   By: AAnner CreteM.D.   On: 04/19/2022 01:07   DG Chest Port 1 View  Result Date: 04/18/2022 CLINICAL DATA:  Shortness of breath EXAM: PORTABLE CHEST 1 VIEW COMPARISON:  None Available. FINDINGS: The heart size and mediastinal contours are within normal limits. Both lungs are clear. The visualized skeletal structures are unremarkable. IMPRESSION: No active disease. Electronically Signed   By: ARonney AstersM.D.   On: 04/18/2022 20:41    Scheduled Meds:  enoxaparin (LOVENOX) injection  40 mg Subcutaneous Q24H   guaiFENesin  600 mg Oral BID   ipratropium-albuterol  3 mL Nebulization QID   methylPREDNISolone (SOLU-MEDROL) injection  40 mg Intravenous Q12H   Followed by   [Derrill MemoON 04/20/2022] predniSONE  40 mg Oral Q breakfast   Continuous Infusions:  sodium chloride 100 mL/hr at 04/19/22 0013   cefTRIAXone (ROCEPHIN)  IV Stopped (04/19/22 0155)     LOS: 0 days    Time spent: 5Coal MD [facility]   If 7PM-7AM,  please contact night-coverage

## 2022-04-19 NOTE — ED Notes (Signed)
Pt given sandwich tray and drink per his request.

## 2022-04-19 NOTE — ED Notes (Signed)
IP MD Mansy in room at this time.  Interpreter [phone] used for assessment. Pt states cough x 22 days and SHOB x 1 day, denies wheezing prior to arrival in ED.  Endorses CP w/cough. Simple mask removed and pt placed on 4LNC at this time.

## 2022-04-19 NOTE — Assessment & Plan Note (Signed)
-   He will be monitored for alcohol withdrawal. - He will be placed on as needed IV Ativan.

## 2022-04-20 NOTE — Progress Notes (Signed)
PROGRESS NOTE    Vincent Bradshaw  [id number] DOB: [birth year] DOA: 04/18/2022 PCP: Pcp, No    Brief Narrative: This 32 years old male with PMH significant for marijuana and ETOH abuse presented to the ED with acute onset of worsening shortness of breath and inability to breathe for couple of days, He also reports  cough for over last 2 days as well as associated wheezing intermittently over last 2 days.  He has been having chest pain only with cough with no radiation or diaphoresis.  He was tachypneic, tachycardic,  normotensive in the ED.  He was hypoxic with SpO2 of 85% on room air requiring 4 L of oxygen to maintain saturation above 92%.  High-sensitivity troponin negative.,  RSV, COVID and influenza negative.  Chest x-ray showed no acute cardiopulmonary disease. Patient was given magnesium sulfate, DuoNebs, IV Solu-Medrol and continuous albuterol nebulizer as well as IV Rocephin and admitted for possible bronchitis with bronchospasm.  CTA chest ruled out pulmonary embolism or aortic dissection.  Assessment & Plan:   Principal Problem:   Acute bronchitis with bronchospasm Active Problems:   Marijuana abuse   Alcohol abuse   Acute respiratory failure with hypoxia (HCC)  Acute bronchitis with bronchospasm: Patient denies any history of asthma. He presented with acute shortness of breath, significant wheezing requiring magnesium sulfate, DuoNeb, Solu-Medrol.   Continue IV Solu-Medrol 40 mg q 12hr . Continue with scheduled and as needed bronchodilators. Continue mucolytic therapy with Mucinex. Continue ceftriaxone for possible bronchitis. Continue supplemental oxygen and wean as tolerated. CTA chest ruled out pulmonary embolism or aortic dissection.  Acute hypoxic respiratory failure secondary to above. Continue supplemental oxygen and wean as tolerated. Continue antibiotics ( Ceftriaxone ) Continue Solu-Medrol 40 mg every 12 hours.  Alcohol abuse. Continue CIWA  protocol. Continue to monitor for alcohol withdrawal.  Marijuana abuse. He is counseled on cessation of smoking marijuana.  Leucocytosis: Likely secondary to steroid use. Continue to monitor.  DVT prophylaxis:Lovenox Code Status:Full code Family Communication:No family at bed side Disposition Plan:   Status is: Inpatient Remains inpatient appropriate because:    Admitted for acute bronchitis with bronchospasm requiring Solu-Medrol and nebulized podiatrist.   Consultants:  None  Procedures: CT chest Antimicrobials: Ceftriaxone  Subjective: Patient seen and examined at bedside.  Overnight events noted.  Spanish [employer]  # [phone] used for translation. Patient reports doing better still requiring 4 L of supplemental oxygen.  Denies any chest pain.  Objective: Vitals:   04/19/22 2031 04/20/22 0010 04/20/22 0531 04/20/22 0748  BP:  131/87 132/78 (!) 135/97  Pulse:  99 91 89  Resp:  19 18 19  $ Temp:  98.2 F (36.8 C) 97.6 F (36.4 C) (!) 97.5 F (36.4 C)  TempSrc:      SpO2: 95% 95% 94% 100%  Weight:      Height:        Intake/Output Summary (Last 24 hours) at 04/20/2022 1042 Last data filed at 04/20/2022 1020 Gross per 24 hour  Intake 1340 ml  Output 2000 ml  Net -660 ml   Filed Weights   04/18/22 1941  Weight: 65.3 kg    Examination:  General exam: Appears comfortable, not in any acute distress. Respiratory system: CTA bilaterally, no wheezing, no crackles, no accessory muscle use, RR 16 Cardiovascular system: S1 & S2 heard, regular rate and rhythm, no murmur. Gastrointestinal system: Abdomen is soft, non tender, non distended, BS+ Central nervous system: Alert and oriented x 3. No focal neurological deficits. Extremities:  No edema, no cyanosis, no clubbing. Skin: No rashes, lesions or ulcers Psychiatry: Judgement and insight appear normal. Mood & affect appropriate.     Data Reviewed: I have personally reviewed following labs and imaging  studies  CBC: Recent Labs  Lab 04/18/22 1945 04/19/22 1730  WBC 8.8 18.7*  HGB 18.7* 17.5*  HCT 53.2* 51.1  MCV 88.4 88.4  PLT 267 [id number]   Basic Metabolic Panel: Recent Labs  Lab 04/18/22 1945 04/19/22 1730  NA 137 135  K 3.5 4.1  CL 108 105  CO2 19* 19*  GLUCOSE 147* 226*  BUN 13 16  CREATININE 0.90 0.95  CALCIUM 8.2* 9.3   GFR: Estimated Creatinine Clearance: 88.9 mL/min (by C-G formula based on SCr of 0.95 mg/dL). Liver Function Tests: No results for input(s): "AST", "ALT", "ALKPHOS", "BILITOT", "PROT", "ALBUMIN" in the last 168 hours. No results for input(s): "LIPASE", "AMYLASE" in the last 168 hours. No results for input(s): "AMMONIA" in the last 168 hours. Coagulation Profile: No results for input(s): "INR", "PROTIME" in the last 168 hours. Cardiac Enzymes: No results for input(s): "CKTOTAL", "CKMB", "CKMBINDEX", "TROPONINI" in the last 168 hours. BNP (last 3 results) No results for input(s): "PROBNP" in the last 8760 hours. HbA1C: No results for input(s): "HGBA1C" in the last 72 hours. CBG: No results for input(s): "GLUCAP" in the last 168 hours. Lipid Profile: No results for input(s): "CHOL", "HDL", "LDLCALC", "TRIG", "CHOLHDL", "LDLDIRECT" in the last 72 hours. Thyroid Function Tests: No results for input(s): "TSH", "T4TOTAL", "FREET4", "T3FREE", "THYROIDAB" in the last 72 hours. Anemia Panel: No results for input(s): "VITAMINB12", "FOLATE", "FERRITIN", "TIBC", "IRON", "RETICCTPCT" in the last 72 hours. Sepsis Labs: No results for input(s): "PROCALCITON", "LATICACIDVEN" in the last 168 hours.  Recent Results (from the past 240 hour(s))  Resp panel by RT-PCR (RSV, Flu A&B, Covid) Anterior Nasal Swab     Status: None   Collection Time: 04/18/22  7:44 PM   Specimen: Anterior Nasal Swab  Result Value Ref Range Status   SARS Coronavirus 2 by RT PCR NEGATIVE NEGATIVE Final    Comment: (NOTE) SARS-CoV-2 target nucleic acids are NOT DETECTED.  The  SARS-CoV-2 RNA is generally detectable in upper respiratory specimens during the acute phase of infection. The lowest concentration of SARS-CoV-2 viral copies this assay can detect is 138 copies/mL. A negative result does not preclude SARS-Cov-2 infection and should not be used as the sole basis for treatment or other patient management decisions. A negative result may occur with  improper specimen collection/handling, submission of specimen other than nasopharyngeal swab, presence of viral mutation(s) within the areas targeted by this assay, and inadequate number of viral copies(<138 copies/mL). A negative result must be combined with clinical observations, patient history, and epidemiological information. The expected result is Negative.  Fact Sheet for Patients:  EntrepreneurPulse.com.au  Fact Sheet for Healthcare Providers:  IncredibleEmployment.be  This test is no t yet approved or cleared by the [place] FDA and  has been authorized for detection and/or diagnosis of SARS-CoV-2 by FDA under an Emergency Use Authorization (EUA). This EUA will remain  in effect (meaning this test can be used) for the duration of the COVID-19 declaration under Section 564(b)(1) of the Act, 21 U.S.C.section 360bbb-3(b)(1), unless the authorization is terminated  or revoked sooner.       Influenza A by PCR NEGATIVE NEGATIVE Final   Influenza B by PCR NEGATIVE NEGATIVE Final    Comment: (NOTE) The Xpert Xpress SARS-CoV-2/FLU/RSV plus assay is intended as an  aid in the diagnosis of influenza from Nasopharyngeal swab specimens and should not be used as a sole basis for treatment. Nasal washings and aspirates are unacceptable for Xpert Xpress SARS-CoV-2/FLU/RSV testing.  Fact Sheet for Patients: EntrepreneurPulse.com.au  Fact Sheet for Healthcare Providers: IncredibleEmployment.be  This test is not yet approved or  cleared by the [place] FDA and has been authorized for detection and/or diagnosis of SARS-CoV-2 by FDA under an Emergency Use Authorization (EUA). This EUA will remain in effect (meaning this test can be used) for the duration of the COVID-19 declaration under Section 564(b)(1) of the Act, 21 U.S.C. section 360bbb-3(b)(1), unless the authorization is terminated or revoked.     Resp Syncytial Virus by PCR NEGATIVE NEGATIVE Final    Comment: (NOTE) Fact Sheet for Patients: EntrepreneurPulse.com.au  Fact Sheet for Healthcare Providers: IncredibleEmployment.be  This test is not yet approved or cleared by the [place] FDA and has been authorized for detection and/or diagnosis of SARS-CoV-2 by FDA under an Emergency Use Authorization (EUA). This EUA will remain in effect (meaning this test can be used) for the duration of the COVID-19 declaration under Section 564(b)(1) of the Act, 21 U.S.C. section 360bbb-3(b)(1), unless the authorization is terminated or revoked.  Performed at [facility], [address] [place],  [address]     Radiology Studies: CT Angio Chest Pulmonary Embolism (PE) W or WO Contrast  Result Date: 04/19/2022 CLINICAL DATA:  Shortness of breath. EXAM: CT ANGIOGRAPHY CHEST WITH CONTRAST TECHNIQUE: Multidetector CT imaging of the chest was performed using the standard protocol during bolus administration of intravenous contrast. Multiplanar CT image reconstructions and MIPs were obtained to evaluate the vascular anatomy. RADIATION DOSE REDUCTION: This exam was performed according to the departmental dose-optimization program which includes automated exposure control, adjustment of the mA and/or kV according to patient size and/or use of iterative reconstruction technique. CONTRAST:  36m OMNIPAQUE IOHEXOL 350 MG/ML SOLN COMPARISON:  Chest radiograph dated 04/18/2022. FINDINGS: Evaluation of this exam is  limited due to respiratory motion artifact. Cardiovascular: There is no cardiomegaly or pericardial effusion. The thoracic aorta is unremarkable. The origins of the great vessels of the aortic arch appear patent. Evaluation of the pulmonary arteries is limited due to respiratory motion. No pulmonary artery embolus identified. Mediastinum/Nodes: No hilar or mediastinal adenopathy. The esophagus and the thyroid gland are grossly unremarkable. No mediastinal fluid collection. Lungs/Pleura: Faint bilateral lower lobe ground-glass densities, likely atelectasis. No focal consolidation, pleural effusion, or pneumothorax. The central airways are patent. Upper Abdomen: Fatty liver. Musculoskeletal: No acute osseous pathology. Review of the MIP images confirms the above findings. IMPRESSION: 1. No acute intrathoracic pathology. No CT evidence of pulmonary artery embolus. 2. Fatty liver. Electronically Signed   By: AAnner CreteM.D.   On: 04/19/2022 01:07   DG Chest Port 1 View  Result Date: 04/18/2022 CLINICAL DATA:  Shortness of breath EXAM: PORTABLE CHEST 1 VIEW COMPARISON:  None Available. FINDINGS: The heart size and mediastinal contours are within normal limits. Both lungs are clear. The visualized skeletal structures are unremarkable. IMPRESSION: No active disease. Electronically Signed   By: ARonney AstersM.D.   On: 04/18/2022 20:41    Scheduled Meds:  enoxaparin (LOVENOX) injection  40 mg Subcutaneous [id number]  folic acid  1 mg Oral Daily   guaiFENesin  600 mg Oral BID   ipratropium-albuterol  3 mL Nebulization QID   multivitamin with minerals  1 tablet Oral Daily   predniSONE  40 mg Oral Q  breakfast   thiamine  100 mg Oral Daily   Or   thiamine  100 mg Intravenous Daily   Continuous Infusions:  cefTRIAXone (ROCEPHIN)  IV 1 g (04/20/22 0103)     LOS: 1 day    Time spent: [place], MD [facility]   If 7PM-7AM, please contact night-coverage

## 2022-04-20 NOTE — Progress Notes (Signed)
Patient asked, in spanish, by [facility] (clinical staff interpreter) if he would prefer an in person interpreter or the tele interpreter. Patient agreeable to either.

## 2022-04-20 NOTE — Progress Notes (Signed)
I used Ana (clinical staff interpreter) to obtainment of assessment, discussion of plan of care and education.

## 2022-04-20 NOTE — Plan of Care (Signed)
  Problem: Education: Goal: Knowledge of disease or condition will improve Outcome: Progressing Goal: Knowledge of the prescribed therapeutic regimen will improve Outcome: Progressing   Problem: Activity: Goal: Ability to tolerate increased activity will improve Outcome: Progressing Goal: Will verbalize the importance of balancing activity with adequate rest periods Outcome: Progressing   Problem: Respiratory: Goal: Ability to maintain a clear airway will improve Outcome: Progressing Goal: Levels of oxygenation will improve Outcome: Progressing Goal: Ability to maintain adequate ventilation will improve Outcome: Progressing   Problem: Education: Goal: Knowledge of General Education information will improve Description: Including pain rating scale, medication(s)/side effects and non-pharmacologic comfort measures Outcome: Progressing   Problem: Health Behavior/Discharge Planning: Goal: Ability to manage health-related needs will improve Outcome: Progressing

## 2022-04-21 ENCOUNTER — Other Ambulatory Visit: Payer: Self-pay

## 2022-04-21 LAB — CBC
HCT: 52.1 % — ABNORMAL HIGH (ref 39.0–52.0)
Hemoglobin: 17.7 g/dL — ABNORMAL HIGH (ref 13.0–17.0)
MCH: 30.7 pg (ref 26.0–34.0)
MCHC: 34 g/dL (ref 30.0–36.0)
MCV: 90.3 fL (ref 80.0–100.0)
Platelets: 261 10*3/uL (ref 150–400)
RBC: 5.77 MIL/uL (ref 4.22–5.81)
RDW: 12.6 % (ref 11.5–15.5)
WBC: 12.6 10*3/uL — ABNORMAL HIGH (ref 4.0–10.5)
nRBC: 0 % (ref 0.0–0.2)

## 2022-04-21 LAB — BASIC METABOLIC PANEL
Anion gap: 9 (ref 5–15)
BUN: 20 mg/dL (ref 6–20)
CO2: 23 mmol/L (ref 22–32)
Calcium: 8.8 mg/dL — ABNORMAL LOW (ref 8.9–10.3)
Chloride: 106 mmol/L (ref 98–111)
Creatinine, Ser: 0.78 mg/dL (ref 0.61–1.24)
GFR, Estimated: >60 mL/min (ref >60)
Glucose, Bld: 141 mg/dL — ABNORMAL HIGH (ref 70–99)
Potassium: 3.7 mmol/L (ref 3.5–5.1)
Sodium: 138 mmol/L (ref 135–145)

## 2022-04-21 LAB — PHOSPHORUS: Phosphorus: 4.7 mg/dL — ABNORMAL HIGH (ref 2.5–4.6)

## 2022-04-21 LAB — MAGNESIUM: Magnesium: 2.5 mg/dL — ABNORMAL HIGH (ref 1.7–2.4)

## 2022-04-21 MED ORDER — CEFDINIR 300 MG PO CAPS: 300.0000 mg | ORAL_CAPSULE | Freq: Two times a day (BID) | ORAL | 0 refills | Status: DC

## 2022-04-21 MED ORDER — ALBUTEROL SULFATE HFA 108 (90 BASE) MCG/ACT IN AERS: 2.0000 | INHALATION_SPRAY | Freq: Four times a day (QID) | RESPIRATORY_TRACT | 2 refills | Status: DC | PRN

## 2022-04-21 MED ORDER — CEFDINIR 300 MG PO CAPS
300.0000 mg | ORAL_CAPSULE | Freq: Two times a day (BID) | ORAL | 0 refills | Status: AC
  Filled 2022-04-21: qty 6, 3d supply, fill #0

## 2022-04-21 MED ORDER — PREDNISONE 20 MG PO TABS: 40.0000 mg | ORAL_TABLET | Freq: Every day | ORAL | 0 refills | Status: DC

## 2022-04-21 MED ORDER — PREDNISONE 20 MG PO TABS
40.0000 mg | ORAL_TABLET | Freq: Every day | ORAL | 0 refills | Status: AC
  Filled 2022-04-21: qty 6, 3d supply, fill #0

## 2022-04-21 MED ORDER — GUAIFENESIN ER 600 MG PO TB12: 600.0000 mg | ORAL_TABLET | Freq: Two times a day (BID) | ORAL | 0 refills | Status: DC

## 2022-04-21 MED ORDER — GUAIFENESIN ER 600 MG PO TB12
600.0000 mg | ORAL_TABLET | Freq: Two times a day (BID) | ORAL | 0 refills | Status: DC
  Filled 2022-04-21: qty 20, 10d supply, fill #0

## 2022-04-21 NOTE — Discharge Instructions (Addendum)
Advised to take Omnicef 300 mg twice daily for CAP. Advised to take prednisone 40 mg daily for 3 days. Advised to take albuterol inhaler as needed.    Intensive Outpatient Programs   [facility] [address] [place],  [place], [place] (Inpatient and [phone] (Suboxone and Methadone) [address] [phone]  [facility]: [facility] Programs - Intensive Outpatient [place] [phone] [place], [place], [place] (Outpatient, Inpatient, [facility] (Groups and Residental) (insurance only) [place], [place]   [facility] (for caregivers and family) [address], [place], [place]  Residential Treatment Programs  [facility](2 years) Residential: 37 days)[facility] (Landa[place], [place], [place] or [phone]  [facility] [place], [place], [place]  [place] (RTS) [place], [place], [place] Admissions: 8am-3pm M-F  [facility] Program: Residential Program [phone] Days)             [facility]: [facility]  [place], [place], [place] or [phone] in Hours over the weekend or by referral)   [place] Crisis: [facility]:[phone] (for crisis response 24 hours a day)

## 2022-04-21 NOTE — Discharge Summary (Addendum)
Physician Discharge Summary  Vincent Bradshaw [id number] DOB: 1990-11-24 DOA: 04/18/2022  PCP: Merryl Hacker, No  Admit date: 04/18/2022  Discharge date: 04/21/2022  Admitted From: Home.  Disposition: Home.  Recommendations for Outpatient Follow-up:  Follow up with PCP in 1-2 weeks. Please obtain BMP/CBC in one week. Advised to take Omnicef 300 mg twice daily for 3 days for Bronchitis. Advised to take prednisone 40 mg daily for 3 days. Advised to take albuterol inhaler as needed.  Home Health: None Equipment/Devices: None  Discharge Condition: Stable CODE STATUS: Full code Diet recommendation: Heart Healthy   [facility]rief [facility] Course: This 32 years old male with PMH significant for marijuana and ETOH abuse presented to the ED with acute onset of worsening shortness of breath and inability to breathe for couple of days, He also reports cough for over last 2 days as well as associated wheezing intermittently over last 2 days.  He has been having chest pain only with cough with no radiation or diaphoresis.  He was tachypneic, tachycardic,  normotensive in the ED.  He was hypoxic with SpO2 of 85% on room air requiring 4 L of oxygen to maintain saturation above 92%.  High-sensitivity troponin negative.,  RSV, COVID and influenza negative. Chest x-ray showed no acute cardiopulmonary disease. Patient was given magnesium sulfate, DuoNebs, IV Solu-Medrol and continuous albuterol nebulizer as well as IV Rocephin and admitted for possible bronchitis with bronchospasm.  CTA chest ruled out pulmonary embolism or aortic dissection. Patient was continued on IV antibiotics, IV fluid resuscitation.  Patient successfully weaned down to room air.  Feels much better and wants to be discharged.  Patient is being discharged on Omnicef and prednisone for 3 more days.  Discharge Diagnoses:  Principal Problem:   Acute bronchitis with bronchospasm Active Problems:   Marijuana abuse   Alcohol  abuse   Acute respiratory failure with hypoxia (HCC)  Acute bronchitis with bronchospasm: Patient denies any history of asthma. He presented with acute shortness of breath, significant wheezing requiring magnesium sulfate, DuoNeb, Solu-Medrol.   Continue IV Solu-Medrol 40 mg q 12hr . Continue with scheduled and as needed bronchodilators. Continue mucolytic therapy with Mucinex. Continue ceftriaxone for possible bronchitis. Continue supplemental oxygen and wean as tolerated. CTA chest ruled out pulmonary embolism or aortic dissection. Patient feels better wants to be discharged.  Patient being discharged on prednisone and Omnicef for 3 days.   Acute hypoxic respiratory failure secondary to above. Continue supplemental oxygen and wean as tolerated. Continue antibiotics ( Ceftriaxone ) Continue Solu-Medrol 40 mg every 12 hours. He is successfully weaned down to room air.   Alcohol abuse. Continue CIWA protocol. Continue to monitor for alcohol withdrawal.   Marijuana abuse. He is counseled on cessation of smoking marijuana.   Leucocytosis: Likely secondary to steroid use. Continue to monitor.  Discharge Instructions  Discharge Instructions     Call MD for:  difficulty breathing, headache or visual disturbances   Complete by: As directed    Call MD for:  persistant dizziness or light-headedness   Complete by: As directed    Diet - low sodium heart healthy   Complete by: As directed    Diet Carb Modified   Complete by: As directed    Discharge instructions   Complete by: As directed    Advised to follow up PCP in one week. Advised to take Omnicef 300 mg twice daily for treatment. Advised to take prednisone 40 mg daily for 3 days. Advised to take albuterol inhaler as needed.  Increase activity slowly   Complete by: As directed       Allergies as of 04/21/2022   No Known Allergies      Medication List     TAKE these medications    albuterol 108 (90 Base)  MCG/ACT inhaler Commonly known as: VENTOLIN HFA Inhale 2 puffs into the lungs every 6 (six) hours as needed for wheezing or shortness of breath.   cefdinir 300 MG capsule Commonly known as: OMNICEF Take 1 capsule (300 mg total) by mouth 2 (two) times daily for 3 days.   guaiFENesin 600 MG 12 hr tablet Commonly known as: MUCINEX Take 1 tablet (600 mg total) by mouth 2 (two) times daily.   predniSONE 20 MG tablet Commonly known as: DELTASONE Take 2 tablets (40 mg total) by mouth daily with breakfast for 3 days. Start taking on: April 22, 2022        [place] Follow up in 1 week(s).   Contact information: [place] [id number] [phone]                No Known Allergies  Consultations: None   Procedures/Studies: CT Angio Chest Pulmonary Embolism (PE) W or WO Contrast  Result Date: 04/19/2022 CLINICAL DATA:  Shortness of breath. EXAM: CT ANGIOGRAPHY CHEST WITH CONTRAST TECHNIQUE: Multidetector CT imaging of the chest was performed using the standard protocol during bolus administration of intravenous contrast. Multiplanar CT image reconstructions and MIPs were obtained to evaluate the vascular anatomy. RADIATION DOSE REDUCTION: This exam was performed according to the departmental dose-optimization program which includes automated exposure control, adjustment of the mA and/or kV according to patient size and/or use of iterative reconstruction technique. CONTRAST:  24m OMNIPAQUE IOHEXOL 350 MG/ML SOLN COMPARISON:  Chest radiograph dated 04/18/2022. FINDINGS: Evaluation of this exam is limited due to respiratory motion artifact. Cardiovascular: There is no cardiomegaly or pericardial effusion. The thoracic aorta is unremarkable. The origins of the great vessels of the aortic arch appear patent. Evaluation of the pulmonary arteries is limited due to respiratory motion. No pulmonary artery embolus identified.  Mediastinum/Nodes: No hilar or mediastinal adenopathy. The esophagus and the thyroid gland are grossly unremarkable. No mediastinal fluid collection. Lungs/Pleura: Faint bilateral lower lobe ground-glass densities, likely atelectasis. No focal consolidation, pleural effusion, or pneumothorax. The central airways are patent. Upper Abdomen: Fatty liver. Musculoskeletal: No acute osseous pathology. Review of the MIP images confirms the above findings. IMPRESSION: 1. No acute intrathoracic pathology. No CT evidence of pulmonary artery embolus. 2. Fatty liver. Electronically Signed   By: AAnner CreteM.D.   On: 04/19/2022 01:07   DG Chest Port 1 View  Result Date: 04/18/2022 CLINICAL DATA:  Shortness of breath EXAM: PORTABLE CHEST 1 VIEW COMPARISON:  None Available. FINDINGS: The heart size and mediastinal contours are within normal limits. Both lungs are clear. The visualized skeletal structures are unremarkable. IMPRESSION: No active disease. Electronically Signed   By: ARonney AstersM.D.   On: 04/18/2022 20:41     Subjective: Patient was seen and examined at bedside.  Overnight events noted.   Patient feels much better and wants to be discharged.  Spanish interpreter JLa Clede# [phone] for translation.  Discharge Exam: Vitals:   04/21/22 0740 04/21/22 0838  BP:  121/75  Pulse:  97  Resp:  18  Temp:  98.3 F (36.8 C)  SpO2: 95% 92%   Vitals:   04/20/22 2014 04/21/22 0512 04/21/22 0740  04/21/22 0838  BP:  121/72  121/75  Pulse:  68  97  Resp:  16  18  Temp:  98.2 F (36.8 C)  98.3 F (36.8 C)  TempSrc:  Oral    SpO2: 98% 100% 95% 92%  Weight:      Height:        General: Pt is alert, awake, not in acute distress Cardiovascular: RRR, S1/S2 +, no rubs, no gallops Respiratory: CTA bilaterally, no wheezing, no rhonchi Abdominal: Soft, NT, ND, bowel sounds + Extremities: no edema, no cyanosis    The results of significant diagnostics from this hospitalization (including imaging,  microbiology, ancillary and laboratory) are listed below for reference.     Microbiology: Recent Results (from the past 240 hour(s))  Resp panel by RT-PCR (RSV, Flu A&B, Covid) Anterior Nasal Swab     Status: None   Collection Time: 04/18/22  7:44 PM   Specimen: Anterior Nasal Swab  Result Value Ref Range Status   SARS Coronavirus 2 by RT PCR NEGATIVE NEGATIVE Final    Comment: (NOTE) SARS-CoV-2 target nucleic acids are NOT DETECTED.  The SARS-CoV-2 RNA is generally detectable in upper respiratory specimens during the acute phase of infection. The lowest concentration of SARS-CoV-2 viral copies this assay can detect is 138 copies/mL. A negative result does not preclude SARS-Cov-2 infection and should not be used as the sole basis for treatment or other patient management decisions. A negative result may occur with  improper specimen collection/handling, submission of specimen other than nasopharyngeal swab, presence of viral mutation(s) within the areas targeted by this assay, and inadequate number of viral copies(<138 copies/mL). A negative result must be combined with clinical observations, patient history, and epidemiological information. The expected result is Negative.  Fact Sheet for Patients:  EntrepreneurPulse.com.au  Fact Sheet for Healthcare Providers:  IncredibleEmployment.be  This test is no t yet approved or cleared by the [place] FDA and  has been authorized for detection and/or diagnosis of SARS-CoV-2 by FDA under an Emergency Use Authorization (EUA). This EUA will remain  in effect (meaning this test can be used) for the duration of the COVID-19 declaration under Section 564(b)(1) of the Act, 21 U.S.C.section 360bbb-3(b)(1), unless the authorization is terminated  or revoked sooner.       Influenza A by PCR NEGATIVE NEGATIVE Final   Influenza B by PCR NEGATIVE NEGATIVE Final    Comment: (NOTE) The Xpert Xpress  SARS-CoV-2/FLU/RSV plus assay is intended as an aid in the diagnosis of influenza from Nasopharyngeal swab specimens and should not be used as a sole basis for treatment. Nasal washings and aspirates are unacceptable for Xpert Xpress SARS-CoV-2/FLU/RSV testing.  Fact Sheet for Patients: EntrepreneurPulse.com.au  Fact Sheet for Healthcare Providers: IncredibleEmployment.be  This test is not yet approved or cleared by the [place] FDA and has been authorized for detection and/or diagnosis of SARS-CoV-2 by FDA under an Emergency Use Authorization (EUA). This EUA will remain in effect (meaning this test can be used) for the duration of the COVID-19 declaration under Section 564(b)(1) of the Act, 21 U.S.C. section 360bbb-3(b)(1), unless the authorization is terminated or revoked.     Resp Syncytial Virus by PCR NEGATIVE NEGATIVE Final    Comment: (NOTE) Fact Sheet for Patients: EntrepreneurPulse.com.au  Fact Sheet for Healthcare Providers: IncredibleEmployment.be  This test is not yet approved or cleared by the [place] FDA and has been authorized for detection and/or diagnosis of SARS-CoV-2 by FDA under an Emergency Use Authorization (EUA). This  EUA will remain in effect (meaning this test can be used) for the duration of the COVID-19 declaration under Section 564(b)(1) of the Act, 21 U.S.C. section 360bbb-3(b)(1), unless the authorization is terminated or revoked.  Performed at [facility], [address] [place], [place] [address]      Labs: BNP (last 3 results) No results for input(s): "BNP" in the last 8760 hours. Basic Metabolic Panel: Recent Labs  Lab 04/18/22 1945 04/19/22 1730 04/21/22 0425  NA 137 135 138  K 3.5 4.1 3.7  CL 108 105 106  CO2 19* 19* 23  GLUCOSE 147* 226* 141*  BUN 13 16 20  $ CREATININE 0.90 0.95 0.78  CALCIUM 8.2* 9.3 8.8*  MG  --   --  2.5*  PHOS   --   --  4.7*   Liver Function Tests: No results for input(s): "AST", "ALT", "ALKPHOS", "BILITOT", "PROT", "ALBUMIN" in the last 168 hours. No results for input(s): "LIPASE", "AMYLASE" in the last 168 hours. No results for input(s): "AMMONIA" in the last 168 hours. CBC: Recent Labs  Lab 04/18/22 1945 04/19/22 1730 04/21/22 0425  WBC 8.8 18.7* 12.6*  HGB 18.7* 17.5* 17.7*  HCT 53.2* 51.1 52.1*  MCV 88.4 88.4 90.3  PLT 267 297 261   Cardiac Enzymes: No results for input(s): "CKTOTAL", "CKMB", "CKMBINDEX", "TROPONINI" in the last 168 hours. BNP: Invalid input(s): "POCBNP" CBG: No results for input(s): "GLUCAP" in the last 168 hours. D-Dimer No results for input(s): "DDIMER" in the last 72 hours. Hgb A1c No results for input(s): "HGBA1C" in the last 72 hours. Lipid Profile No results for input(s): "CHOL", "HDL", "LDLCALC", "TRIG", "CHOLHDL", "LDLDIRECT" in the last 72 hours. Thyroid function studies No results for input(s): "TSH", "T4TOTAL", "T3FREE", "THYROIDAB" in the last 72 hours.  Invalid input(s): "FREET3" Anemia work up No results for input(s): "VITAMINB12", "FOLATE", "FERRITIN", "TIBC", "IRON", "RETICCTPCT" in the last 72 hours. Urinalysis No results found for: "COLORURINE", "APPEARANCEUR", "LABSPEC", "PHURINE", "GLUCOSEU", "HGBUR", "BILIRUBINUR", "KETONESUR", "PROTEINUR", "UROBILINOGEN", "NITRITE", "LEUKOCYTESUR" Sepsis Labs Recent Labs  Lab 04/18/22 1945 04/19/22 1730 04/21/22 0425  WBC 8.8 18.7* 12.6*   Microbiology Recent Results (from the past 240 hour(s))  Resp panel by RT-PCR (RSV, Flu A&B, Covid) Anterior Nasal Swab     Status: None   Collection Time: 04/18/22  7:44 PM   Specimen: Anterior Nasal Swab  Result Value Ref Range Status   SARS Coronavirus 2 by RT PCR NEGATIVE NEGATIVE Final    Comment: (NOTE) SARS-CoV-2 target nucleic acids are NOT DETECTED.  The SARS-CoV-2 RNA is generally detectable in upper respiratory specimens during the acute  phase of infection. The lowest concentration of SARS-CoV-2 viral copies this assay can detect is 138 copies/mL. A negative result does not preclude SARS-Cov-2 infection and should not be used as the sole basis for treatment or other patient management decisions. A negative result may occur with  improper specimen collection/handling, submission of specimen other than nasopharyngeal swab, presence of viral mutation(s) within the areas targeted by this assay, and inadequate number of viral copies(<138 copies/mL). A negative result must be combined with clinical observations, patient history, and epidemiological information. The expected result is Negative.  Fact Sheet for Patients:  EntrepreneurPulse.com.au  Fact Sheet for Healthcare Providers:  IncredibleEmployment.be  This test is no t yet approved or cleared by the [place] FDA and  has been authorized for detection and/or diagnosis of SARS-CoV-2 by FDA under an Emergency Use Authorization (EUA). This EUA will remain  in effect (meaning this  test can be used) for the duration of the COVID-19 declaration under Section 564(b)(1) of the Act, 21 U.S.C.section 360bbb-3(b)(1), unless the authorization is terminated  or revoked sooner.       Influenza A by PCR NEGATIVE NEGATIVE Final   Influenza B by PCR NEGATIVE NEGATIVE Final    Comment: (NOTE) The Xpert Xpress SARS-CoV-2/FLU/RSV plus assay is intended as an aid in the diagnosis of influenza from Nasopharyngeal swab specimens and should not be used as a sole basis for treatment. Nasal washings and aspirates are unacceptable for Xpert Xpress SARS-CoV-2/FLU/RSV testing.  Fact Sheet for Patients: EntrepreneurPulse.com.au  Fact Sheet for Healthcare Providers: IncredibleEmployment.be  This test is not yet approved or cleared by the [place] FDA and has been authorized for detection and/or diagnosis of  SARS-CoV-2 by FDA under an Emergency Use Authorization (EUA). This EUA will remain in effect (meaning this test can be used) for the duration of the COVID-19 declaration under Section 564(b)(1) of the Act, 21 U.S.C. section 360bbb-3(b)(1), unless the authorization is terminated or revoked.     Resp Syncytial Virus by PCR NEGATIVE NEGATIVE Final    Comment: (NOTE) Fact Sheet for Patients: EntrepreneurPulse.com.au  Fact Sheet for Healthcare Providers: IncredibleEmployment.be  This test is not yet approved or cleared by the [place] FDA and has been authorized for detection and/or diagnosis of SARS-CoV-2 by FDA under an Emergency Use Authorization (EUA). This EUA will remain in effect (meaning this test can be used) for the duration of the COVID-19 declaration under Section 564(b)(1) of the Act, 21 U.S.C. section 360bbb-3(b)(1), unless the authorization is terminated or revoked.  Performed at [facility], [address] [place], [place] [address]      Time coordinating discharge: Over 30 minutes  SIGNED:   Shawna Clamp, MD  [facility] 04/21/2022, 2:28 PM Pager   If 7PM-7AM, please contact night-coverage

## 2022-05-04 ENCOUNTER — Other Ambulatory Visit: Payer: Self-pay

## 2022-06-21 ENCOUNTER — Other Ambulatory Visit: Payer: Self-pay
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
## 2022-11-23 ENCOUNTER — Emergency Department
Admission: EM | Admit: 2022-11-23 | Discharge: 2022-11-23 | Disposition: A | Discharge status: Home / Self Care | Payer: Self-pay | Attending: Emergency Medicine | Admitting: Emergency Medicine

## 2022-11-23 ENCOUNTER — Other Ambulatory Visit: Payer: Self-pay

## 2022-11-23 DIAGNOSIS — J40 Bronchitis, not specified as acute or chronic: Secondary | ICD-10-CM | POA: Insufficient documentation

## 2022-11-23 DIAGNOSIS — J069 Acute upper respiratory infection, unspecified: Secondary | ICD-10-CM | POA: Insufficient documentation

## 2022-11-23 MED ORDER — PSEUDOEPH-BROMPHEN-DM 30-2-10 MG/5ML PO SYRP: 2.5000 mL | ORAL_SOLUTION | Freq: Four times a day (QID) | ORAL | 0 refills | Status: DC | PRN

## 2022-11-23 MED ORDER — BENZONATATE 100 MG PO CAPS: ORAL_CAPSULE | ORAL | 0 refills | Status: DC

## 2022-11-23 MED ORDER — ALBUTEROL SULFATE HFA 108 (90 BASE) MCG/ACT IN AERS: 2.0000 | INHALATION_SPRAY | Freq: Four times a day (QID) | RESPIRATORY_TRACT | 1 refills | Status: DC | PRN

## 2022-11-23 NOTE — ED Triage Notes (Signed)
Patient reports dry cough since last night; denies sick contacts.

## 2022-11-23 NOTE — Discharge Instructions (Addendum)
Take the prescription meds as directed. Drink plenty of fluids and rest as necessary. Follow-up with a local urgent care or this emergency department

## 2022-11-23 NOTE — ED Notes (Signed)
Pt refused rpt VS prior to DC.  Pt A&Ox4 and ambulatory to lobby with NAD noted.  All questions answered at this time.

## 2022-11-23 NOTE — ED Provider Notes (Signed)
[facility] Emergency Department Provider Note     Event Date/Time   First MD Initiated Contact with Patient 11/23/22 1810     (approximate)   History   Cough   HPI History limited by Spanish language.  Tele-interpreter [phone]) used for interview and exam.  Vincent Bradshaw is a 31 y.o. male with a history of alcohol use and marijuana use, presents to the ED with reports of a dry cough since last night.  Patient reports nonproductive cough denies any associated fevers, chills, or sweats.  Denies any sick contacts, recent travel, or other exposures.  Physical Exam   Triage Vital Signs: ED Triage Vitals  Encounter Vitals Group     BP 11/23/22 1707 (!) 141/100     Systolic BP Percentile --      Diastolic BP Percentile --      Pulse Rate 11/23/22 1707 84     Resp 11/23/22 1707 20     Temp 11/23/22 1707 98.9 F (37.2 C)     Temp Source 11/23/22 1707 Oral     SpO2 11/23/22 1707 94 %     Weight 11/23/22 1704 160 lb (72.6 kg)     Height 11/23/22 1704 5' (1.524 m)     Head Circumference --      Peak Flow --      Pain Score 11/23/22 1704 2     Pain Loc --      Pain Education --      Exclude from Growth Chart --     Most recent vital signs: Vitals:   11/23/22 1707  BP: (!) 141/100  Pulse: 84  Resp: 20  Temp: 98.9 F (37.2 C)  SpO2: 94%    General Awake, no distress. NAD HEENT NCAT. PERRL. EOMI. No rhinorrhea. Mucous membranes are moist.  CV:  Good peripheral perfusion. CTA RESP:  Normal effort.  No wheeze, rales, or rhonchi.  Intermittent cough noted. ABD:  No distention.    ED Results / Procedures / Treatments   Labs (all labs ordered are listed, but only abnormal results are displayed) Labs Reviewed - No data to display   EKG   RADIOLOGY  No results found.   PROCEDURES:  Critical Care performed: No  Procedures   MEDICATIONS ORDERED IN ED: Medications - No data to display   IMPRESSION / MDM /  ASSESSMENT AND PLAN / ED COURSE  I reviewed the triage vital signs and the nursing notes.                              Differential diagnosis includes, but is not limited to, COVID, flu, RSV, bronchitis, CAP  Patient's presentation is most consistent with acute, uncomplicated illness.  Patient's diagnosis is consistent with bronchitis. Patient will be discharged home with prescriptions for Tessalon Perles, Bromfed syrup, and albuterol inhaler. Patient is to follow up with local urgent care as discussed, as needed or otherwise directed. Patient is given ED precautions to return to the ED for any worsening or new symptoms.     FINAL CLINICAL IMPRESSION(S) / ED DIAGNOSES   Final diagnoses:  Viral URI with cough     Rx / DC Orders   ED Discharge Orders     None        Note:  This document was prepared using Dragon voice recognition software and may include unintentional dictation errors.    Tashai Catino, Charlesetta Ivory,  PA-C 11/23/22 2041    Merwyn Katos, MD 11/23/22 [phone]

## 2023-02-02 ENCOUNTER — Other Ambulatory Visit: Payer: Self-pay

## 2023-02-02 ENCOUNTER — Emergency Department
Admission: EM | Admit: 2023-02-02 | Discharge: 2023-02-02 | Disposition: A | Discharge status: Home / Self Care | Payer: Self-pay | Attending: Emergency Medicine | Admitting: Emergency Medicine

## 2023-02-02 ENCOUNTER — Emergency Department: Payer: Self-pay

## 2023-02-02 DIAGNOSIS — R0602 Shortness of breath: Secondary | ICD-10-CM | POA: Insufficient documentation

## 2023-02-02 DIAGNOSIS — J069 Acute upper respiratory infection, unspecified: Secondary | ICD-10-CM | POA: Insufficient documentation

## 2023-02-02 DIAGNOSIS — Z20822 Contact with and (suspected) exposure to covid-19: Secondary | ICD-10-CM | POA: Insufficient documentation

## 2023-02-02 LAB — RESP PANEL BY RT-PCR (RSV, FLU A&B, COVID)  RVPGX2
Influenza A by PCR: NEGATIVE
Influenza B by PCR: NEGATIVE
Resp Syncytial Virus by PCR: NEGATIVE
SARS Coronavirus 2 by RT PCR: NEGATIVE

## 2023-02-02 MED ORDER — PSEUDOEPH-BROMPHEN-DM 30-2-10 MG/5ML PO SYRP: 2.5000 mL | ORAL_SOLUTION | Freq: Four times a day (QID) | ORAL | 0 refills | Status: DC | PRN

## 2023-02-02 NOTE — Discharge Instructions (Addendum)
Follow-up with one of the primary care providers listed on your discharge papers.  Please go to the following website to schedule new (and existing) patient appointments:   [email or web address]   The following is a list of primary care offices in the area who are accepting new patients at this time.  Please reach out to one of them directly and let them know you would like to schedule an appointment to follow up on an Emergency Department visit, and/or to establish a new primary care provider (PCP).  There are likely other primary care clinics in the are who are accepting new patients, but this is an excellent place to start:  [facility] Lead physician: Dr Shirlee Latch [address] [place], [place] [address] [phone]  [facility] Lead Physician: Dr Alba Cory [address], [place], [place] [address] [phone]  [facility]  Lead Physician: Dr Olevia Perches [address] [place], [place] [address] [phone]  [facility] Lead Physician: Dr Sofie Hartigan [address], [place], [place] [address] [phone]  [facility] Lead Physician: Dr Bari Edward [address] [place], [place], [place] [address] [phone]

## 2023-02-02 NOTE — ED Triage Notes (Signed)
Spanish Interpreter used: Pt arrives via POV from home. Pt reports productive cough, congestion, and headache since Saturday. Pt is AxOx4. Pt reports he has been having some sob since yesterday.

## 2023-02-02 NOTE — ED Provider Notes (Signed)
[facility] Provider Note    Event Date/Time   First MD Initiated Contact with Patient 02/02/23 [phone]     (approximate)   History   Cough and Shortness of Breath   HPI Vincent Bradshaw Spanish interpreter Marchelle Folks present. Vincent Bradshaw is a 32 y.o. male   presents to the ED with complaint of productive cough, congestion, headache for approximately 1 week and some shortness of breath that started yesterday.  Patient is unaware of any fever but reports some chills.  Denies nausea, vomiting or diarrhea.  No one else in the family is sick at this time.  No known sick contacts.  Patient does smoke daily with alcohol and marijuana use.      Physical Exam   Triage Vital Signs: ED Triage Vitals  Encounter Vitals Group     BP 02/02/23 0841 (!) 129/92     Systolic BP Percentile --      Diastolic BP Percentile --      Pulse Rate 02/02/23 0841 70     Resp 02/02/23 0841 17     Temp 02/02/23 0841 98.7 F (37.1 C)     Temp src --      SpO2 02/02/23 0841 95 %     Weight 02/02/23 0844 160 lb (72.6 kg)     Height 02/02/23 0843 5' (1.524 m)     Head Circumference --      Peak Flow --      Pain Score --      Pain Loc --      Pain Education --      Exclude from Growth Chart --     Most recent vital signs: Vitals:   02/02/23 0841  BP: (!) 129/92  Pulse: 70  Resp: 17  Temp: 98.7 F (37.1 C)  SpO2: 95%     General: Awake, no distress.  CV:  Good peripheral perfusion.  Heart rate and rate rhythm. Resp:  Normal effort.  Lungs are clear bilaterally. Abd:  No distention.  Other:     ED Results / Procedures / Treatments   Labs (all labs ordered are listed, but only abnormal results are displayed) Labs Reviewed  RESP PANEL BY RT-PCR (RSV, FLU A&B, COVID)  RVPGX2     RADIOLOGY  X-ray images were reviewed by myself independent of the radiologist and was negative for acute cardiopulmonary disease.   PROCEDURES:  Critical Care performed:    Procedures   MEDICATIONS ORDERED IN ED: Medications - No data to display   IMPRESSION / MDM / ASSESSMENT AND PLAN / ED COURSE  I reviewed the triage vital signs and the nursing notes.   Differential diagnosis includes, but is not limited to, viral URI, COVID, influenza, RSV, pneumonia, bronchitis.  32 year old male presents to the ED with complaint of upper respiratory symptoms for several days.  [facility] interpreter Kandis Cocking was and also in to see the patient with me at the time of discharge and explained that his respiratory panel was negative as well as his chest x-ray.  A prescription for Bromfed-DM was being sent to the pharmacy for him to begin again taking for cough and congestion.  He is to follow-up with a PCP and a list of offices and clinics was listed on his discharge papers in the event that he does not have a regular doctor.      Patient's presentation is most consistent with acute complicated illness / injury requiring diagnostic workup.  FINAL CLINICAL IMPRESSION(S) / ED  DIAGNOSES   Final diagnoses:  Viral URI with cough     Rx / DC Orders   ED Discharge Orders          Ordered    brompheniramine-pseudoephedrine-DM 30-2-10 MG/5ML syrup  4 times daily PRN        02/02/23 0958             Note:  This document was prepared using Dragon voice recognition software and may include unintentional dictation errors.   Tommi Rumps, PA-C 02/02/23 1006    Jene Every, MD 02/02/23 1012

## 2023-02-02 NOTE — ED Notes (Signed)
See triage notes. Patient c/o productive cough, congestion, and headache since Saturday.

## 2024-04-12 ENCOUNTER — Emergency Department
Admission: EM | Admit: 2024-04-12 | Discharge: 2024-04-12 | Disposition: A | Discharge status: Home / Self Care | Payer: Self-pay | Source: Home / Self Care

## 2024-04-12 ENCOUNTER — Other Ambulatory Visit: Payer: Self-pay

## 2024-04-12 ENCOUNTER — Emergency Department: Payer: Self-pay

## 2024-04-12 DIAGNOSIS — J069 Acute upper respiratory infection, unspecified: Secondary | ICD-10-CM

## 2024-04-12 DIAGNOSIS — J40 Bronchitis, not specified as acute or chronic: Secondary | ICD-10-CM

## 2024-04-12 HISTORY — DX: Bronchitis, not specified as acute or chronic: J40

## 2024-04-12 LAB — RESP PANEL BY RT-PCR (RSV, FLU A&B, COVID)  RVPGX2
Influenza A by PCR: NEGATIVE
Influenza B by PCR: NEGATIVE
Resp Syncytial Virus by PCR: NEGATIVE
SARS Coronavirus 2 by RT PCR: NEGATIVE

## 2024-04-12 MED ORDER — PSEUDOEPH-BROMPHEN-DM 30-2-10 MG/5ML PO SYRP: 5.0000 mL | ORAL_SOLUTION | Freq: Four times a day (QID) | ORAL | 0 refills | Status: AC | PRN

## 2024-04-12 MED ORDER — ALBUTEROL SULFATE HFA 108 (90 BASE) MCG/ACT IN AERS: 2.0000 | INHALATION_SPRAY | Freq: Four times a day (QID) | RESPIRATORY_TRACT | 2 refills | Status: AC | PRN

## 2024-04-12 MED ORDER — PREDNISONE 20 MG PO TABS: 20.0000 mg | ORAL_TABLET | Freq: Two times a day (BID) | ORAL | 0 refills | Status: AC

## 2024-04-12 MED ORDER — BENZONATATE 100 MG PO CAPS
200.0000 mg | ORAL_CAPSULE | Freq: Once | ORAL | Status: AC
  Administered 2024-04-12: 200 mg via ORAL
  Filled 2024-04-12: qty 2

## 2024-04-12 MED ORDER — IPRATROPIUM-ALBUTEROL 0.5-2.5 (3) MG/3ML IN SOLN
3.0000 mL | Freq: Once | RESPIRATORY_TRACT | Status: AC
  Administered 2024-04-12: 3 mL via RESPIRATORY_TRACT
  Filled 2024-04-12: qty 3

## 2024-04-12 MED ORDER — PREDNISONE 20 MG PO TABS
60.0000 mg | ORAL_TABLET | Freq: Once | ORAL | Status: AC
  Administered 2024-04-12: 60 mg via ORAL
  Filled 2024-04-12: qty 3

## 2024-04-12 MED ORDER — BENZONATATE 100 MG PO CAPS: 200.0000 mg | ORAL_CAPSULE | Freq: Three times a day (TID) | ORAL | 1 refills | Status: AC | PRN

## 2024-04-12 NOTE — ED Triage Notes (Signed)
 Pt to ED for flu sx, dry cough started yesterday. Hx bronchitis.

## 2024-04-12 NOTE — Discharge Instructions (Addendum)
 Your exam is normal and your viral panel test is negative.  No signs of pneumonia.  Your symptoms of wheezing and chest tightness, likely represent a viral bronchitis.  Take the prescription steroid daily as directed.  Use the inhaler and cough medicine as prescribed.  Follow-up with the local community clinic for ongoing evaluation.  Return to the ED as needed.  Su examen es normal y la prueba de deteccin de virus dio negativo. No hay signos de neumona. Sus sntomas de sibilancias y opresin en el pecho probablemente se deban a una bronquitis viral. Tome el esteroide recetado diariamente segn las indicaciones. Use el inhalador y el jarabe para la tos segn lo recetado. Acuda a la clnica comunitaria local para un seguimiento. Regrese a la sala de emergencias si es necesario.

## 2024-04-12 NOTE — ED Notes (Signed)
 Pt in with co cough since yesterday, hx of bronchitis. States does smoke marijuana, states felt shob while sleeping, no distress noted.

## 2024-04-12 NOTE — ED Provider Notes (Incomplete)
 "   [facility] Emergency Department Provider Note     None    (approximate)   History   Cough   HPI  History limited by Spanish language.  Interpreter (Raquel, RN) present for interview and exam.  Vincent Bradshaw is a 34 y.o. male with a history of bronchitis, presents to the ED endorsing flulike symptoms.  Patient reports onset of dry nonproductive cough yesterday.  No reports any fevers, chills, sweats, chest pain, or shortness of breath.  No current medications.   Physical Exam   Triage Vital Signs: ED Triage Vitals  Encounter Vitals Group     BP 04/12/24 1342 (!) 162/84     Girls Systolic BP Percentile --      Girls Diastolic BP Percentile --      Boys Systolic BP Percentile --      Boys Diastolic BP Percentile --      Pulse Rate 04/12/24 1342 78     Resp 04/12/24 1342 18     Temp 04/12/24 1342 98.7 F (37.1 C)     Temp src --      SpO2 04/12/24 1342 95 %     Weight 04/12/24 1343 160 lb (72.6 kg)     Height 04/12/24 1343 5' (1.524 m)     Head Circumference --      Peak Flow --      Pain Score 04/12/24 1343 0     Pain Loc --      Pain Education --      Exclude from Growth Chart --     Most recent vital signs: Vitals:   04/12/24 1342 04/12/24 1533  BP: (!) 162/84 (!) 144/100  Pulse: 78 88  Resp: 18 19  Temp: 98.7 F (37.1 C)   SpO2: 95% 97%    General Awake, no distress. NAD HEENT NCAT. PERRL. EOMI. No rhinorrhea. Mucous membranes are moist.  CV:  Good peripheral perfusion. RRR RESP:  Normal effort.  Mild bilateral wheeze and rhonchi noted ABD:  No distention.    ED Results / Procedures / Treatments   Labs (all labs ordered are listed, but only abnormal results are displayed) Labs Reviewed  RESP PANEL BY RT-PCR (RSV, FLU A&B, COVID)  RVPGX2    EKG   RADIOLOGY  I personally viewed and evaluated these images as part of my medical decision making, as well as reviewing the written report by the  radiologist.  ED Provider Interpretation: No acute findings  CXR 2V  IMPRESSION: 1. No acute intrathoracic process.  PROCEDURES:  Critical Care performed: No  Procedures   MEDICATIONS ORDERED IN ED: Medications  ipratropium-albuterol  (DUONEB) 0.5-2.5 (3) MG/3ML nebulizer solution 3 mL (3 mLs Nebulization Given 04/12/24 1519)  predniSONE  (DELTASONE ) tablet 60 mg (60 mg Oral Given 04/12/24 1519)  benzonatate  (TESSALON ) capsule 200 mg (200 mg Oral Given 04/12/24 1519)     IMPRESSION / MDM / ASSESSMENT AND PLAN / ED COURSE  I reviewed the triage vital signs and the nursing notes.                              Differential diagnosis includes, but is not limited to,, flu, RSV, viral URI, bronchitis, CAP  Patient's presentation is most consistent with acute complicated illness / injury requiring diagnostic workup.  Patient's diagnosis is consistent with bronchitis likely of a viral etiology.  Patient with reassuring exam and workup at this time, including  a negative viral panel test.  Chest returned by me, shows no acute intrathoracic process.  Patient treated with a DuoNeb in the ED with some improvement of his symptomology.  Patient will be discharged home with prescriptions for albuterol  MDI, Tessalon  Perles, Bromfed syrup, and prednisone . Patient is to follow up with her primary provider as needed or otherwise directed. Patient is given ED precautions to return to the ED for any worsening or new symptoms.   FINAL CLINICAL IMPRESSION(S) / ED DIAGNOSES   Final diagnoses:  Bronchitis  Viral URI with cough     Rx / DC Orders   ED Discharge Orders          Ordered    benzonatate  (TESSALON ) 100 MG capsule  3 times daily PRN        04/12/24 1524    predniSONE  (DELTASONE ) 20 MG tablet  2 times daily with meals        04/12/24 1524    brompheniramine-pseudoephedrine-DM 30-2-10 MG/5ML syrup  4 times daily PRN        04/12/24 1524    albuterol  (VENTOLIN  HFA) 108 (90 Base) MCG/ACT  inhaler  Every 6 hours PRN        04/12/24 1524             Note:  This document was prepared using Dragon voice recognition software and may include unintentional dictation errors.    Loyd Candida LULLA Aldona, PA-C 04/13/24 2239  "
